# Patient Record
Sex: Male | Born: 1935
Health system: Southern US, Community
[De-identification: ages and names within clinical notes are randomized; demographics above are authoritative.]

## PROBLEM LIST (undated history)

## (undated) DIAGNOSIS — I10 Essential (primary) hypertension: Secondary | ICD-10-CM

## (undated) HISTORY — PX: PACEMAKER INSERTION: SHX728

---

## 2015-11-07 DIAGNOSIS — M545 Low back pain: Secondary | ICD-10-CM | POA: Diagnosis not present

## 2015-11-07 DIAGNOSIS — J101 Influenza due to other identified influenza virus with other respiratory manifestations: Secondary | ICD-10-CM | POA: Diagnosis not present

## 2016-01-13 DIAGNOSIS — I10 Essential (primary) hypertension: Secondary | ICD-10-CM | POA: Diagnosis not present

## 2016-01-13 DIAGNOSIS — I480 Paroxysmal atrial fibrillation: Secondary | ICD-10-CM | POA: Diagnosis not present

## 2016-01-13 DIAGNOSIS — I340 Nonrheumatic mitral (valve) insufficiency: Secondary | ICD-10-CM | POA: Diagnosis not present

## 2016-01-13 DIAGNOSIS — Z950 Presence of cardiac pacemaker: Secondary | ICD-10-CM | POA: Diagnosis not present

## 2016-04-15 DIAGNOSIS — N529 Male erectile dysfunction, unspecified: Secondary | ICD-10-CM | POA: Diagnosis not present

## 2016-04-15 DIAGNOSIS — R319 Hematuria, unspecified: Secondary | ICD-10-CM | POA: Diagnosis not present

## 2016-04-15 DIAGNOSIS — R300 Dysuria: Secondary | ICD-10-CM | POA: Diagnosis not present

## 2016-04-15 DIAGNOSIS — N401 Enlarged prostate with lower urinary tract symptoms: Secondary | ICD-10-CM | POA: Diagnosis not present

## 2016-06-24 DIAGNOSIS — Z950 Presence of cardiac pacemaker: Secondary | ICD-10-CM | POA: Diagnosis not present

## 2016-06-24 DIAGNOSIS — I10 Essential (primary) hypertension: Secondary | ICD-10-CM | POA: Diagnosis not present

## 2016-06-24 DIAGNOSIS — I495 Sick sinus syndrome: Secondary | ICD-10-CM | POA: Diagnosis not present

## 2016-06-24 DIAGNOSIS — I480 Paroxysmal atrial fibrillation: Secondary | ICD-10-CM | POA: Diagnosis not present

## 2016-07-22 DIAGNOSIS — E784 Other hyperlipidemia: Secondary | ICD-10-CM | POA: Diagnosis not present

## 2016-07-22 DIAGNOSIS — I480 Paroxysmal atrial fibrillation: Secondary | ICD-10-CM | POA: Diagnosis not present

## 2016-07-22 DIAGNOSIS — I495 Sick sinus syndrome: Secondary | ICD-10-CM | POA: Diagnosis not present

## 2016-07-22 DIAGNOSIS — K5730 Diverticulosis of large intestine without perforation or abscess without bleeding: Secondary | ICD-10-CM | POA: Diagnosis not present

## 2016-07-22 DIAGNOSIS — I4892 Unspecified atrial flutter: Secondary | ICD-10-CM | POA: Diagnosis not present

## 2016-07-22 DIAGNOSIS — I10 Essential (primary) hypertension: Secondary | ICD-10-CM | POA: Diagnosis not present

## 2016-07-22 DIAGNOSIS — Z0000 Encounter for general adult medical examination without abnormal findings: Secondary | ICD-10-CM | POA: Diagnosis not present

## 2016-09-25 DIAGNOSIS — Z950 Presence of cardiac pacemaker: Secondary | ICD-10-CM | POA: Diagnosis not present

## 2016-12-25 DIAGNOSIS — Z950 Presence of cardiac pacemaker: Secondary | ICD-10-CM | POA: Diagnosis not present

## 2017-07-26 DIAGNOSIS — Z23 Encounter for immunization: Secondary | ICD-10-CM | POA: Diagnosis not present

## 2017-08-02 DIAGNOSIS — I480 Paroxysmal atrial fibrillation: Secondary | ICD-10-CM | POA: Diagnosis not present

## 2017-08-02 DIAGNOSIS — I495 Sick sinus syndrome: Secondary | ICD-10-CM | POA: Diagnosis not present

## 2017-08-02 DIAGNOSIS — I10 Essential (primary) hypertension: Secondary | ICD-10-CM | POA: Diagnosis not present

## 2017-08-02 DIAGNOSIS — I340 Nonrheumatic mitral (valve) insufficiency: Secondary | ICD-10-CM | POA: Diagnosis not present

## 2017-08-02 DIAGNOSIS — E7849 Other hyperlipidemia: Secondary | ICD-10-CM | POA: Diagnosis not present

## 2017-08-02 DIAGNOSIS — Z0000 Encounter for general adult medical examination without abnormal findings: Secondary | ICD-10-CM | POA: Diagnosis not present

## 2018-03-05 ENCOUNTER — Emergency Department (HOSPITAL_BASED_OUTPATIENT_CLINIC_OR_DEPARTMENT_OTHER)
Admission: EM | Admit: 2018-03-05 | Discharge: 2018-03-05 | Disposition: A | Discharge status: Home / Self Care | Payer: PPO | Attending: Emergency Medicine | Admitting: Emergency Medicine

## 2018-03-05 ENCOUNTER — Other Ambulatory Visit: Payer: Self-pay

## 2018-03-05 ENCOUNTER — Emergency Department (HOSPITAL_BASED_OUTPATIENT_CLINIC_OR_DEPARTMENT_OTHER): Payer: PPO

## 2018-03-05 ENCOUNTER — Encounter (HOSPITAL_BASED_OUTPATIENT_CLINIC_OR_DEPARTMENT_OTHER): Payer: Self-pay | Admitting: Emergency Medicine

## 2018-03-05 DIAGNOSIS — I10 Essential (primary) hypertension: Secondary | ICD-10-CM | POA: Insufficient documentation

## 2018-03-05 DIAGNOSIS — Z7982 Long term (current) use of aspirin: Secondary | ICD-10-CM | POA: Insufficient documentation

## 2018-03-05 DIAGNOSIS — Z79899 Other long term (current) drug therapy: Secondary | ICD-10-CM | POA: Insufficient documentation

## 2018-03-05 DIAGNOSIS — Z950 Presence of cardiac pacemaker: Secondary | ICD-10-CM | POA: Diagnosis not present

## 2018-03-05 DIAGNOSIS — R310 Gross hematuria: Secondary | ICD-10-CM | POA: Diagnosis not present

## 2018-03-05 DIAGNOSIS — R319 Hematuria, unspecified: Secondary | ICD-10-CM | POA: Diagnosis not present

## 2018-03-05 HISTORY — DX: Essential (primary) hypertension: I10

## 2018-03-05 LAB — BASIC METABOLIC PANEL
Anion gap: 9 (ref 5–15)
BUN: 22 mg/dL (ref 8–23)
CALCIUM: 8.8 mg/dL — AB (ref 8.9–10.3)
CO2: 24 mmol/L (ref 22–32)
CREATININE: 0.97 mg/dL (ref 0.61–1.24)
Chloride: 107 mmol/L (ref 98–111)
GFR calc Af Amer: >60 mL/min (ref >60)
GLUCOSE: 96 mg/dL (ref 70–99)
Potassium: 3.9 mmol/L (ref 3.5–5.1)
Sodium: 140 mmol/L (ref 135–145)

## 2018-03-05 LAB — URINALYSIS, MICROSCOPIC (REFLEX): RBC / HPF: >50 RBC/hpf (ref 0–5)

## 2018-03-05 LAB — URINALYSIS, ROUTINE W REFLEX MICROSCOPIC

## 2018-03-05 LAB — CBC WITH DIFFERENTIAL/PLATELET
Basophils Absolute: 0 10*3/uL (ref 0.0–0.1)
Basophils Relative: 0 %
EOS ABS: 0 10*3/uL (ref 0.0–0.7)
EOS PCT: 1 %
HCT: 35.4 % — ABNORMAL LOW (ref 39.0–52.0)
Hemoglobin: 12 g/dL — ABNORMAL LOW (ref 13.0–17.0)
Lymphocytes Relative: 26 %
Lymphs Abs: 1.3 10*3/uL (ref 0.7–4.0)
MCH: 32.6 pg (ref 26.0–34.0)
MCHC: 33.9 g/dL (ref 30.0–36.0)
MCV: 96.2 fL (ref 78.0–100.0)
MONO ABS: 0.5 10*3/uL (ref 0.1–1.0)
Monocytes Relative: 9 %
Neutro Abs: 3.4 10*3/uL (ref 1.7–7.7)
Neutrophils Relative %: 64 %
PLATELETS: 171 10*3/uL (ref 150–400)
RBC: 3.68 MIL/uL — AB (ref 4.22–5.81)
RDW: 12.4 % (ref 11.5–15.5)
WBC: 5.2 10*3/uL (ref 4.0–10.5)

## 2018-03-05 NOTE — ED Triage Notes (Signed)
Pt reports hematuria since Thursday. Denies pain.

## 2018-03-05 NOTE — ED Notes (Signed)
[facility] Alert, NAD, calm, interactive, resps e/u, speaking in clear complete sentences, no dyspnea noted, skin W&D, recent VSS, (denies: pain, sob, nausea, dizziness or visual changes), steady on feet, "ready to go". [facility]

## 2018-03-05 NOTE — ED Notes (Signed)
Patient transported to CT 

## 2018-03-05 NOTE — ED Provider Notes (Signed)
[facility] EMERGENCY DEPARTMENT Provider Note   CSN: [id number] Arrival date & time: 03/05/18  1630     History   Chief Complaint Chief Complaint  Patient presents with  . Hematuria    HPI Casey Marshall is a 84 y.o. male with PMH/o HTN, inguinal hernia who presents for evaluation of hematuria that began 3 days ago.  Patient reports that initially, it was intermittent.  He states that it improved yesterday and then return earlier today.  Patient states he has not noticed passing any clots.  Patient reports he has not had any difficulty urinating.  Patient reports that he initially thought that he was may be passing a kidney stone which was causing the blood in urine.  He states that he always occasionally has some lower back pain but states that maybe he noticed a little bit of worsening flank pain, on the right side.  He states that he has been able to do his normal activities without any difficulties.  He reports some intermittent burning with urination but states that he always has some at baseline.  Patient states that he has not had any fevers, abdominal pain, testicular pain or swelling, penile pain or swelling.  Patient denies any recent fevers, night sweats.  He states he has noticed a small amount of weight loss but states he does not know over what specific time that has been going on.  She denies any smoking history.  The history is provided by the patient.    Past Medical History:  Diagnosis Date  . Hypertension     There are no active problems to display for this patient.   Past Surgical History:  Procedure Laterality Date  . PACEMAKER INSERTION          Home Medications    Prior to Admission medications   Medication Sig Start Date End Date Taking? Authorizing Provider  aspirin 325 MG EC tablet Take 325 mg by mouth daily.   Yes [provider]  losartan (COZAAR) 25 MG tablet Take 25 mg by mouth daily.   Yes [provider]    omeprazole (PRILOSEC) 20 MG capsule Take 20 mg by mouth daily.   Yes [provider]    Family History No family history on file.  Social History Social History   Tobacco Use  . Smoking status: Never Smoker  . Smokeless tobacco: Never Used  Substance Use Topics  . Alcohol use: Not Currently  . Drug use: Never     Allergies   Patient has no known allergies.   Review of Systems Review of Systems  Constitutional: Negative for fever.  Genitourinary: Positive for hematuria. Negative for dysuria, frequency, penile pain, penile swelling, scrotal swelling and testicular pain.     Physical Exam Updated Vital Signs BP (!) 154/91 (BP Location: Right Arm)   Pulse 71   Temp 97.7 F (36.5 C) (Oral)   Resp 18   Ht 5\' 7"  (1.702 m)   Wt 68 kg (150 lb)   SpO2 97%   BMI 23.49 kg/m   Physical Exam  Constitutional: He is oriented to person, place, and time. He appears well-developed and well-nourished.  HENT:  Head: Normocephalic and atraumatic.  Mouth/Throat: Oropharynx is clear and moist and mucous membranes are normal.  Eyes: Pupils are equal, round, and reactive to light. Conjunctivae, EOM and lids are normal.  Neck: Full passive range of motion without pain.  Cardiovascular: Normal rate, regular rhythm, normal heart sounds and normal pulses.  Exam reveals no gallop and no friction rub.  No murmur heard. Pulmonary/Chest: Effort normal and breath sounds normal.  Abdominal: Soft. Normal appearance. There is no tenderness. There is CVA tenderness (right). There is no rigidity and no guarding. Hernia confirmed negative in the right inguinal area and confirmed negative in the left inguinal area.  Genitourinary: Testes normal and penis normal. Circumcised.  Genitourinary Comments: The exam was performed with a chaperone present. Normal male genitalia. No evidence of rash, ulcers or lesions.   Musculoskeletal: Normal range of motion.  Neurological: He is alert and oriented  to person, place, and time.  Skin: Skin is warm and dry. Capillary refill takes less than 2 seconds.  Psychiatric: He has a normal mood and affect. His speech is normal.  Nursing note and vitals reviewed.    ED Treatments / Results  Labs (all labs ordered are listed, but only abnormal results are displayed) Labs Reviewed  URINALYSIS, ROUTINE W REFLEX MICROSCOPIC - Abnormal; Notable for the following components:      Result Value   Color, Urine RED (*)    APPearance TURBID (*)    Glucose, UA   (*)    Value: TEST NOT REPORTED DUE TO COLOR INTERFERENCE OF URINE PIGMENT   Hgb urine dipstick   (*)    Value: TEST NOT REPORTED DUE TO COLOR INTERFERENCE OF URINE PIGMENT   Bilirubin Urine   (*)    Value: TEST NOT REPORTED DUE TO COLOR INTERFERENCE OF URINE PIGMENT   Ketones, ur   (*)    Value: TEST NOT REPORTED DUE TO COLOR INTERFERENCE OF URINE PIGMENT   Protein, ur   (*)    Value: TEST NOT REPORTED DUE TO COLOR INTERFERENCE OF URINE PIGMENT   Nitrite   (*)    Value: TEST NOT REPORTED DUE TO COLOR INTERFERENCE OF URINE PIGMENT   Leukocytes, UA   (*)    Value: TEST NOT REPORTED DUE TO COLOR INTERFERENCE OF URINE PIGMENT   All other components within normal limits  URINALYSIS, MICROSCOPIC (REFLEX) - Abnormal; Notable for the following components:   Bacteria, UA MANY (*)    All other components within normal limits  BASIC METABOLIC PANEL - Abnormal; Notable for the following components:   Calcium 8.8 (*)    All other components within normal limits  CBC WITH DIFFERENTIAL/PLATELET - Abnormal; Notable for the following components:   RBC 3.68 (*)    Hemoglobin 12.0 (*)    HCT 35.4 (*)    All other components within normal limits  URINE CULTURE    EKG None  Radiology Ct Renal Stone Study  Result Date: 03/05/2018 CLINICAL DATA:  Hematuria for 3 days. No pain. No history of stones. EXAM: CT ABDOMEN AND PELVIS WITHOUT CONTRAST TECHNIQUE: Multidetector CT imaging of the abdomen and  pelvis was performed following the standard protocol without IV contrast. COMPARISON:  None. FINDINGS: Lower chest: Cardiomegaly. Pacemaker device. Small hiatal hernia. Lower chest otherwise normal. Hepatobiliary: Previous cholecystectomy. Suspected mass in the hepatic dome measuring up to 2.6 cm on series 2, image 11 and series 5, image 19. No other masses identified. Pancreas: Unremarkable. No pancreatic ductal dilatation or surrounding inflammatory changes. Spleen: Normal in size without focal abnormality. Adrenals/Urinary Tract: The adrenal glands are normal. No renal stones or masses. No hydronephrosis. The ureters are normal in caliber. No ureteral stones. There is a diverticulum off the left side of the bladder. There appears to be a nodule measuring 9 mm in the posterior  bladder to the left as seen on series 2, image 65 in coronal image 55. Stomach/Bowel: There is a small hiatal hernia. The stomach and small bowel are normal. Colonic diverticulosis without diverticulitis. Fecal loading throughout the colon. The appendix is not visualized but there is no secondary evidence of appendicitis. Vascular/Lymphatic: Atherosclerotic changes are seen in the nonaneurysmal aorta. No adenopathy. Reproductive: Prostate is unremarkable. Other: No abdominal wall hernia or abnormality. No abdominopelvic ascites. Musculoskeletal: No acute or significant osseous findings. IMPRESSION: 1. There is a rounded region of increased attenuation in the left posterior bladder. Given history of hematuria, this could represent a small bladder neoplasm. Blood clot, tumefactive debris, or a fungal ball could have a similar appearance. Recommend urologic consultation with cystoscopy. 2. There is an indeterminate 2.6 cm mass in the hepatic dome. A contrast-enhanced study could better evaluate. Due to the patient's pacemaker, an MRI is not possible. If the patient cannot have contrast for a CT scan, an ultrasound could be performed. 3. Colonic  diverticulosis without diverticulitis. Moderate fecal loading in the colon. 4. Atherosclerotic changes in the nonaneurysmal aorta. Electronically Signed   By: Gerome Sam III M.D   On: 03/05/2018 18:49    Procedures Procedures (including critical care time)  Medications Ordered in ED Medications - No data to display   Initial Impression / Assessment and Plan / ED Course  I have reviewed the triage vital signs and the nursing notes.  Pertinent labs & imaging results that were available during my care of the patient were reviewed by me and considered in my medical decision making (see chart for details).     84 y.o. male who presents for evaluation of hematuria that is been ongoing for last 3 days.  No passing clots, difficulty urinating, fevers, night sweats.  Does report that he has seen [facility] for for previous issues.  No testicular pain or swelling. Patient is afebrile, non-toxic appearing, sitting comfortably on examination table. Vital signs reviewed and stable.  No abnormalities on GU exam.  Patient with mild right-sided CVA tenderness.  Patient is a poor historian is unclear if his pain is new or is his normal pain at baseline.  Low suspicion for kidney stone but a consideration given CVA tenderness and hematuria. Also consider neoplasm.  At this time, patient is not having difficulty urinating.  UA ordered at triage.  Will add additional basic labs, CT renal study for evaluation.  BMP shows normal BUN and creatinine.  CBC without any significant leukocytosis.  Hemoglobin is 12.0.  UA is unable to be evaluated secondary to gross hematuria.  There is mention of pyuria.  We will plan to send for urine culture.  CT renal study shows an area of increased attenuation left posterior bladder that could be suspicious for bladder neoplasm.  Additionally, there is an indeterminate 2.6 cm mass in the hepatic dome.  No evidence of kidney stones.  At this time, patient exhibits no signs  of traction is able to urinate without any difficulty.  Patient will need urology follow-up for cystoscopy and further evaluation of gross hematuria and abnormality seen on CT scan.  Discussed results with patient and family.  Patient sees [facility] though he states he has not seen them for several years.  Patient states that he has not had a recent appointment then.  Will plan to also give outpatient [facility] who is on-call.  Talk to patient to follow-up with them the next 24 to 48 hours for further  evaluation. Patient had ample opportunity for questions and discussion. All patient's questions were answered with full understanding. Strict return precautions discussed. Patient expresses understanding and agreement to plan.   Final Clinical Impressions(s) / ED Diagnoses   Final diagnoses:  Gross hematuria    ED Discharge Orders    None       Rosana Hoes 03/05/18 2024    Little, Ambrose Finland, MD 03/11/18 0003

## 2018-03-05 NOTE — Discharge Instructions (Signed)
As we discussed, there was an abnormal finding on your CT scan that needs further evaluation by your urologist.  Please contact your urologist for evaluation in the next 2 to 4 days.  Additionally I provided you our on-call urologist.  Please call them if he cannot get in with your urologist.  Return to the emergency department for inability to pee, fever, pain, passing clots or any other worsening or concerning symptoms.

## 2018-03-07 LAB — URINE CULTURE: Culture: NO GROWTH

## 2018-03-08 DIAGNOSIS — R319 Hematuria, unspecified: Secondary | ICD-10-CM | POA: Diagnosis not present

## 2018-04-19 DIAGNOSIS — N529 Male erectile dysfunction, unspecified: Secondary | ICD-10-CM | POA: Diagnosis not present

## 2018-04-19 DIAGNOSIS — N401 Enlarged prostate with lower urinary tract symptoms: Secondary | ICD-10-CM | POA: Diagnosis not present

## 2018-08-09 DIAGNOSIS — N529 Male erectile dysfunction, unspecified: Secondary | ICD-10-CM | POA: Diagnosis not present

## 2018-08-09 DIAGNOSIS — R319 Hematuria, unspecified: Secondary | ICD-10-CM | POA: Diagnosis not present

## 2018-08-09 DIAGNOSIS — N401 Enlarged prostate with lower urinary tract symptoms: Secondary | ICD-10-CM | POA: Diagnosis not present

## 2018-08-15 DIAGNOSIS — Z0000 Encounter for general adult medical examination without abnormal findings: Secondary | ICD-10-CM | POA: Diagnosis not present

## 2018-08-15 DIAGNOSIS — I10 Essential (primary) hypertension: Secondary | ICD-10-CM | POA: Diagnosis not present

## 2018-08-15 DIAGNOSIS — E7849 Other hyperlipidemia: Secondary | ICD-10-CM | POA: Diagnosis not present

## 2018-12-02 IMAGING — CT CT RENAL STONE PROTOCOL
2 of 4 series · 15 of 46 positions shown, 17 images · non-contrast
Comparison: None.

CLINICAL DATA: Hematuria for 3 days. No pain. No history of stones.

EXAM:
CT ABDOMEN AND PELVIS WITHOUT CONTRAST
TECHNIQUE: Multidetector CT imaging of the abdomen and pelvis was performed
following the standard protocol without IV contrast.

[Series 2: axial st · axial · 0.73mm/px · z∈[-428,-58]mm · 12 of 85 slices shown, 14 images]
[im 7/85  soft-tissue]
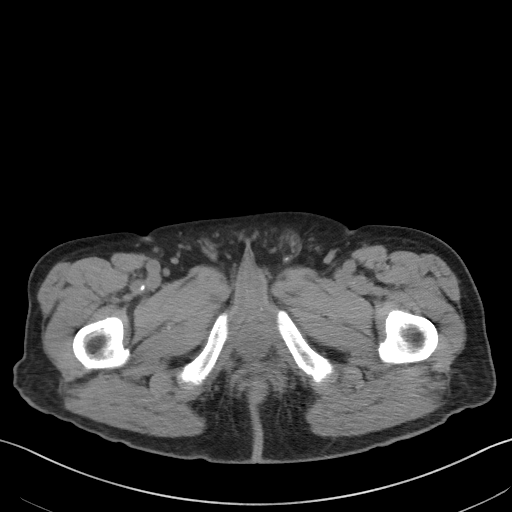
[im 7/85  bone]
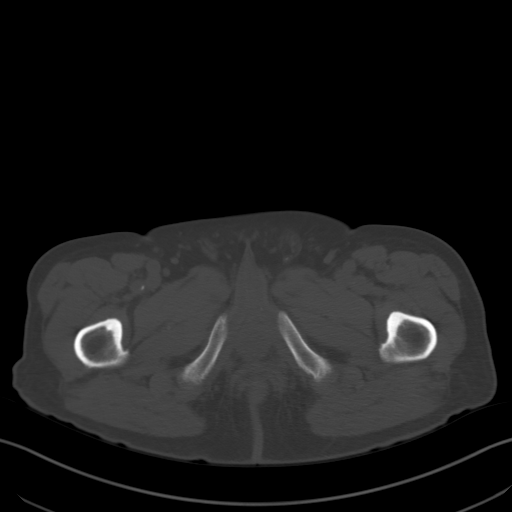
[im 14/85  soft-tissue]
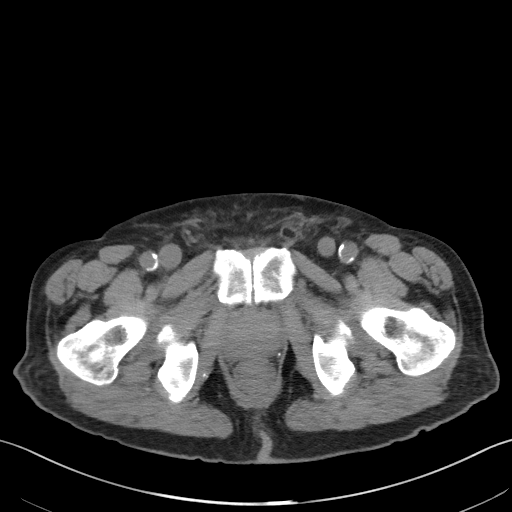
[im 21/85  soft-tissue]
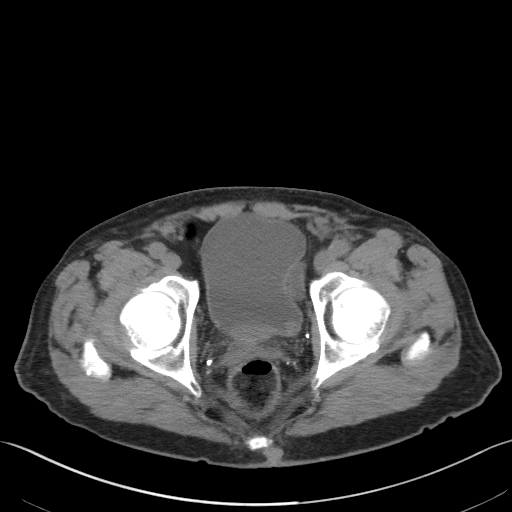
[im 27/85  soft-tissue]
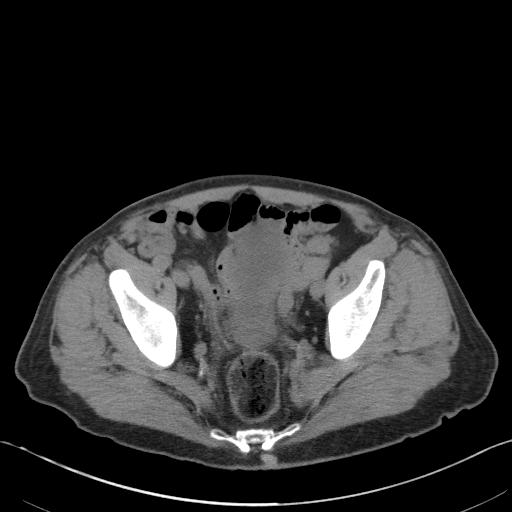
[im 34/85  soft-tissue]
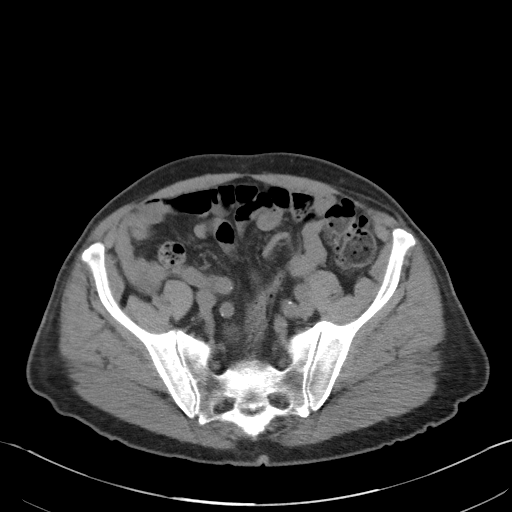
[im 41/85  soft-tissue]
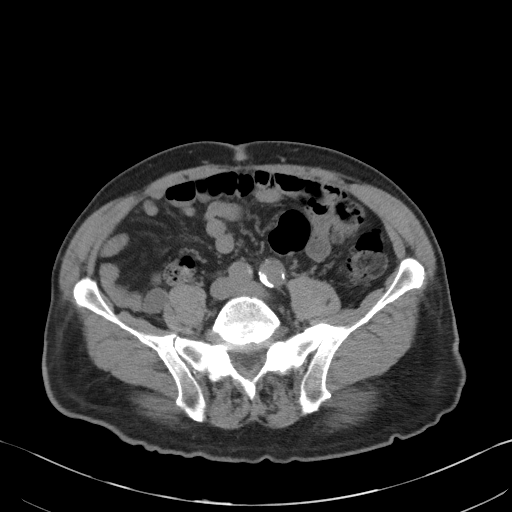
[im 48/85  soft-tissue]
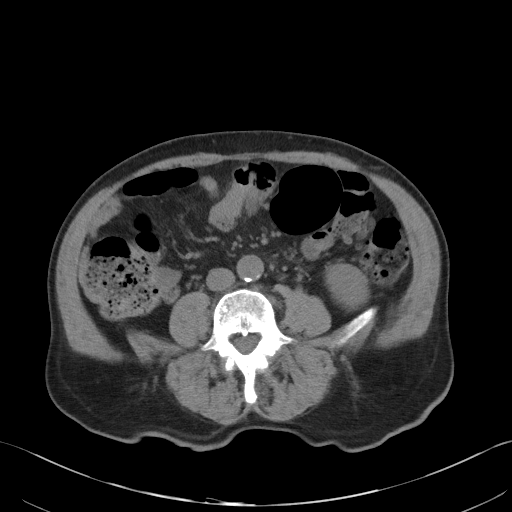
[im 54/85  soft-tissue]
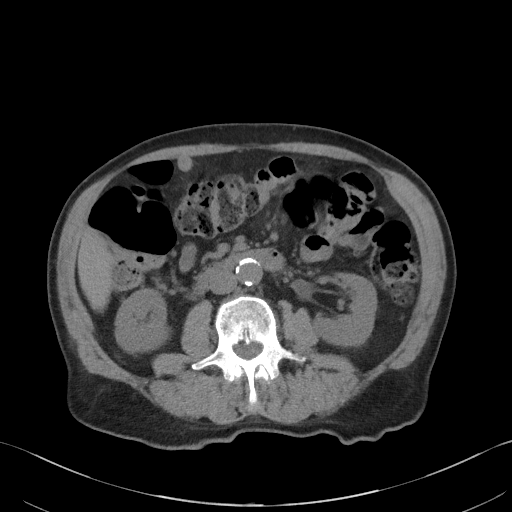
[im 61/85  soft-tissue]
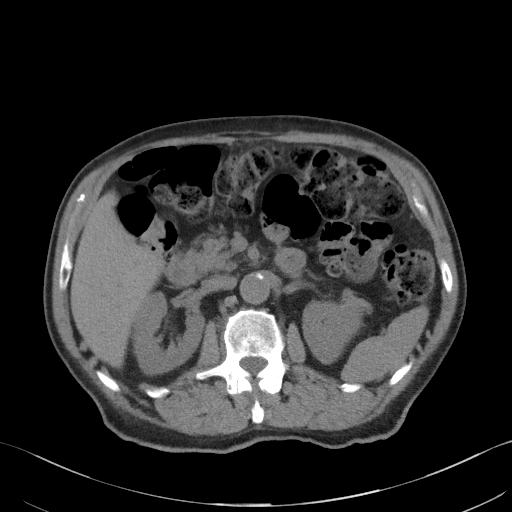
[im 61/85  bone]
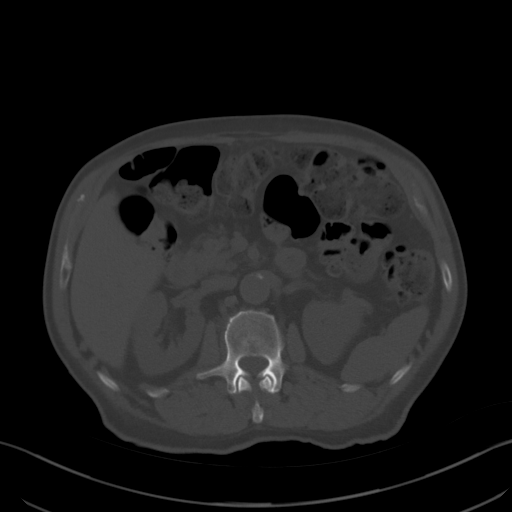
[im 68/85  soft-tissue]
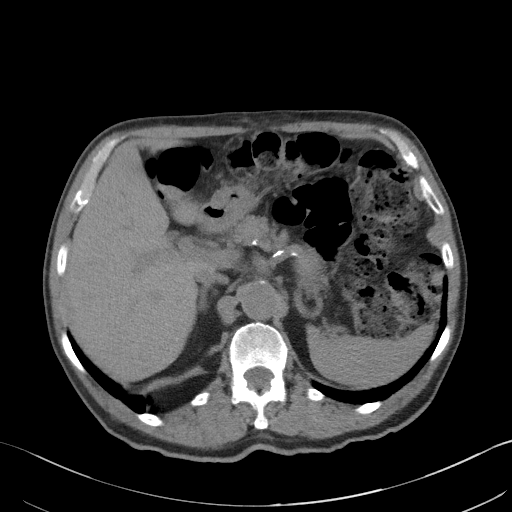
[im 74/85  soft-tissue]
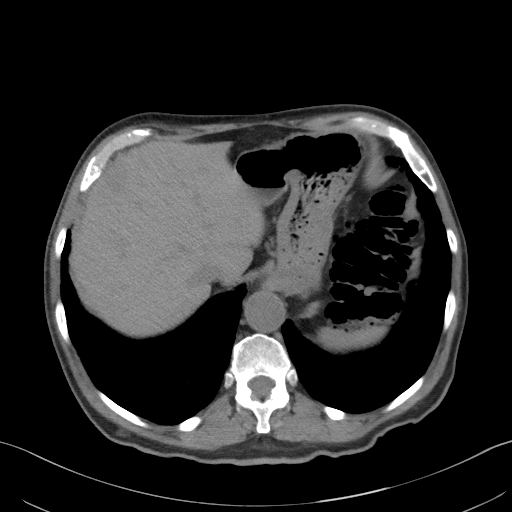
[im 81/85  soft-tissue]
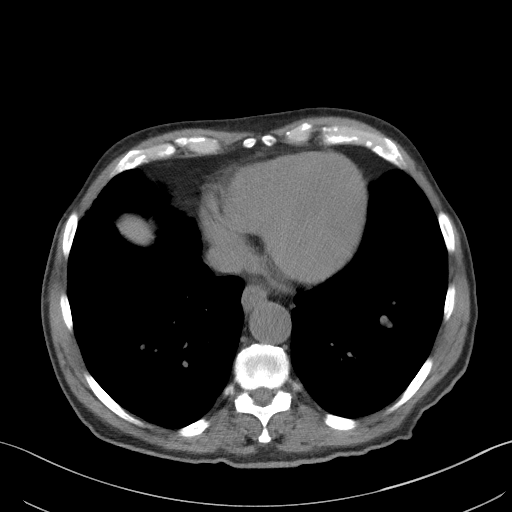

[Series 5: coronal st · coronal · 0.67mm/px · 3 of 89 slices shown]
[im 30/89  soft-tissue]
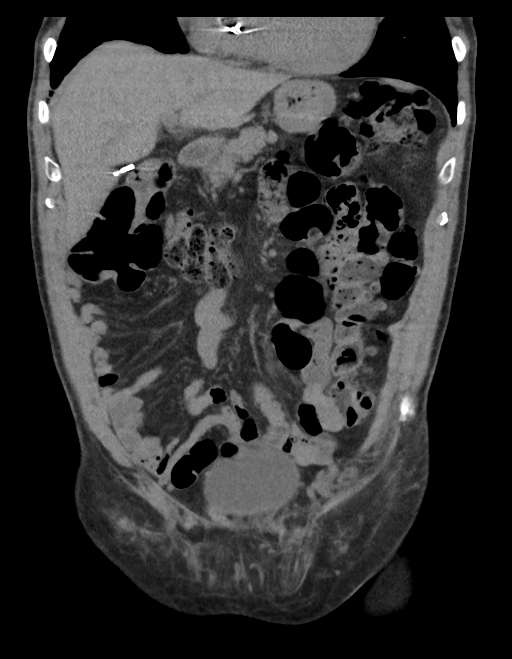
[im 40/89  soft-tissue]
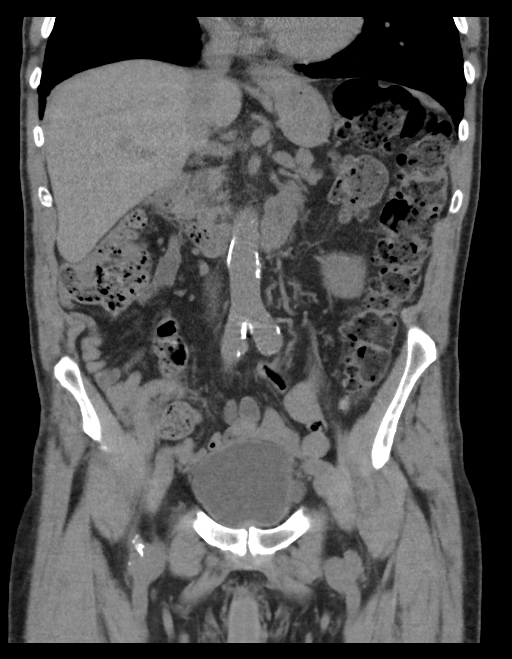
[im 49/89  soft-tissue]
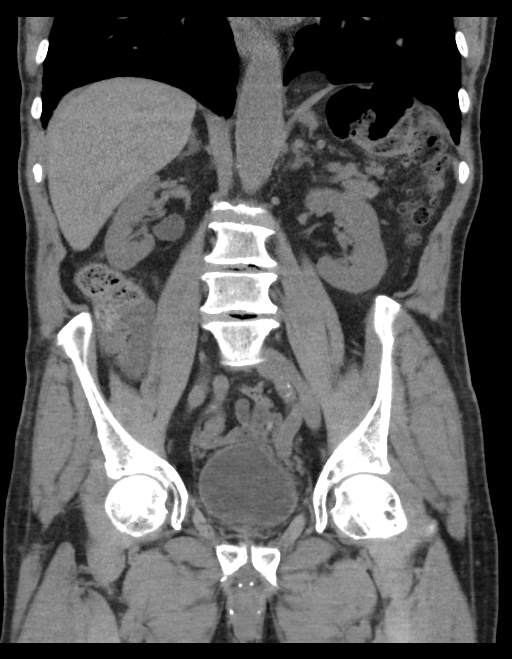

[15 of 46 positions shown; findings below may reference images not displayed]

FINDINGS: Lower chest: Cardiomegaly. Pacemaker device. Small hiatal hernia.
Lower chest otherwise normal.

Hepatobiliary: Previous cholecystectomy. Suspected mass in the
hepatic dome measuring up to 2.6 cm on series 2, image 11 and series
5, image 19. No other masses identified.

Pancreas: Unremarkable. No pancreatic ductal dilatation or
surrounding inflammatory changes.

Spleen: Normal in size without focal abnormality.

Adrenals/Urinary Tract: The adrenal glands are normal. No renal
stones or masses. No hydronephrosis. The ureters are normal in
caliber. No ureteral stones. There is a diverticulum off the left
side of the bladder. There appears to be a nodule measuring 9 mm in
the posterior bladder to the left as seen on series 2, image 65 in
coronal image 55.

Stomach/Bowel: There is a small hiatal hernia. The stomach and small
bowel are normal. Colonic diverticulosis without diverticulitis.
Fecal loading throughout the colon. The appendix is not visualized
but there is no secondary evidence of appendicitis.

Vascular/Lymphatic: Atherosclerotic changes are seen in the
nonaneurysmal aorta. No adenopathy.

Reproductive: Prostate is unremarkable.

Other: No abdominal wall hernia or abnormality. No abdominopelvic
ascites.

Musculoskeletal: No acute or significant osseous findings.
IMPRESSION: 1. There is a rounded region of increased attenuation in the left
posterior bladder. Given history of hematuria, this could represent
a small bladder neoplasm. Blood clot, tumefactive debris, or a
fungal ball could have a similar appearance. Recommend urologic
consultation with cystoscopy.
2. There is an indeterminate 2.6 cm mass in the hepatic dome. A
contrast-enhanced study could better evaluate. Due to the patient's
pacemaker, an MRI is not possible. If the patient cannot have
contrast for a CT scan, an ultrasound could be performed.
3. Colonic diverticulosis without diverticulitis. Moderate fecal
loading in the colon.
4. Atherosclerotic changes in the nonaneurysmal aorta.

## 2019-02-21 DIAGNOSIS — R5383 Other fatigue: Secondary | ICD-10-CM | POA: Diagnosis not present

## 2019-02-21 DIAGNOSIS — R8290 Unspecified abnormal findings in urine: Secondary | ICD-10-CM | POA: Diagnosis not present

## 2019-02-21 DIAGNOSIS — R309 Painful micturition, unspecified: Secondary | ICD-10-CM | POA: Diagnosis not present

## 2019-06-15 DIAGNOSIS — Z20828 Contact with and (suspected) exposure to other viral communicable diseases: Secondary | ICD-10-CM | POA: Diagnosis not present

## 2019-07-18 DIAGNOSIS — E7849 Other hyperlipidemia: Secondary | ICD-10-CM | POA: Diagnosis not present

## 2019-07-18 DIAGNOSIS — I498 Other specified cardiac arrhythmias: Secondary | ICD-10-CM | POA: Diagnosis not present

## 2019-07-18 DIAGNOSIS — Z950 Presence of cardiac pacemaker: Secondary | ICD-10-CM | POA: Diagnosis not present

## 2019-07-18 DIAGNOSIS — I480 Paroxysmal atrial fibrillation: Secondary | ICD-10-CM | POA: Diagnosis not present

## 2019-07-18 DIAGNOSIS — I10 Essential (primary) hypertension: Secondary | ICD-10-CM | POA: Diagnosis not present

## 2019-07-18 DIAGNOSIS — I447 Left bundle-branch block, unspecified: Secondary | ICD-10-CM | POA: Diagnosis not present

## 2019-07-18 DIAGNOSIS — Z952 Presence of prosthetic heart valve: Secondary | ICD-10-CM | POA: Diagnosis not present

## 2019-08-22 DIAGNOSIS — Z0000 Encounter for general adult medical examination without abnormal findings: Secondary | ICD-10-CM | POA: Diagnosis not present

## 2019-08-22 DIAGNOSIS — I480 Paroxysmal atrial fibrillation: Secondary | ICD-10-CM | POA: Diagnosis not present

## 2019-08-22 DIAGNOSIS — I2510 Atherosclerotic heart disease of native coronary artery without angina pectoris: Secondary | ICD-10-CM | POA: Diagnosis not present

## 2019-08-22 DIAGNOSIS — K5730 Diverticulosis of large intestine without perforation or abscess without bleeding: Secondary | ICD-10-CM | POA: Diagnosis not present

## 2019-08-22 DIAGNOSIS — I340 Nonrheumatic mitral (valve) insufficiency: Secondary | ICD-10-CM | POA: Diagnosis not present

## 2019-08-22 DIAGNOSIS — Z952 Presence of prosthetic heart valve: Secondary | ICD-10-CM | POA: Diagnosis not present

## 2019-08-22 DIAGNOSIS — Z23 Encounter for immunization: Secondary | ICD-10-CM | POA: Diagnosis not present

## 2019-08-22 DIAGNOSIS — I10 Essential (primary) hypertension: Secondary | ICD-10-CM | POA: Diagnosis not present

## 2019-08-22 DIAGNOSIS — E7849 Other hyperlipidemia: Secondary | ICD-10-CM | POA: Diagnosis not present

## 2020-09-05 DIAGNOSIS — Z0000 Encounter for general adult medical examination without abnormal findings: Secondary | ICD-10-CM | POA: Diagnosis not present

## 2020-09-05 DIAGNOSIS — I4510 Unspecified right bundle-branch block: Secondary | ICD-10-CM | POA: Diagnosis not present

## 2020-09-05 DIAGNOSIS — E7849 Other hyperlipidemia: Secondary | ICD-10-CM | POA: Diagnosis not present

## 2020-09-05 DIAGNOSIS — R5383 Other fatigue: Secondary | ICD-10-CM | POA: Diagnosis not present

## 2020-09-05 DIAGNOSIS — I2510 Atherosclerotic heart disease of native coronary artery without angina pectoris: Secondary | ICD-10-CM | POA: Diagnosis not present

## 2020-09-05 DIAGNOSIS — I340 Nonrheumatic mitral (valve) insufficiency: Secondary | ICD-10-CM | POA: Diagnosis not present

## 2020-09-05 DIAGNOSIS — I495 Sick sinus syndrome: Secondary | ICD-10-CM | POA: Diagnosis not present

## 2020-09-05 DIAGNOSIS — K5730 Diverticulosis of large intestine without perforation or abscess without bleeding: Secondary | ICD-10-CM | POA: Diagnosis not present

## 2020-09-05 DIAGNOSIS — I4892 Unspecified atrial flutter: Secondary | ICD-10-CM | POA: Diagnosis not present

## 2020-09-05 DIAGNOSIS — Z952 Presence of prosthetic heart valve: Secondary | ICD-10-CM | POA: Diagnosis not present

## 2020-09-05 DIAGNOSIS — I480 Paroxysmal atrial fibrillation: Secondary | ICD-10-CM | POA: Diagnosis not present

## 2020-09-05 DIAGNOSIS — I10 Essential (primary) hypertension: Secondary | ICD-10-CM | POA: Diagnosis not present

## 2020-09-05 DIAGNOSIS — G4733 Obstructive sleep apnea (adult) (pediatric): Secondary | ICD-10-CM | POA: Diagnosis not present

## 2020-11-19 DIAGNOSIS — I10 Essential (primary) hypertension: Secondary | ICD-10-CM | POA: Diagnosis not present

## 2020-11-19 DIAGNOSIS — R531 Weakness: Secondary | ICD-10-CM | POA: Diagnosis not present

## 2020-11-19 DIAGNOSIS — R0989 Other specified symptoms and signs involving the circulatory and respiratory systems: Secondary | ICD-10-CM | POA: Diagnosis not present

## 2020-11-19 DIAGNOSIS — R55 Syncope and collapse: Secondary | ICD-10-CM | POA: Diagnosis not present

## 2020-11-19 DIAGNOSIS — R42 Dizziness and giddiness: Secondary | ICD-10-CM | POA: Diagnosis not present

## 2021-05-01 DIAGNOSIS — M9905 Segmental and somatic dysfunction of pelvic region: Secondary | ICD-10-CM | POA: Diagnosis not present

## 2021-05-01 DIAGNOSIS — M9904 Segmental and somatic dysfunction of sacral region: Secondary | ICD-10-CM | POA: Diagnosis not present

## 2021-05-01 DIAGNOSIS — M4316 Spondylolisthesis, lumbar region: Secondary | ICD-10-CM | POA: Diagnosis not present

## 2021-05-01 DIAGNOSIS — Q72891 Other reduction defects of right lower limb: Secondary | ICD-10-CM | POA: Diagnosis not present

## 2021-05-01 DIAGNOSIS — M5136 Other intervertebral disc degeneration, lumbar region: Secondary | ICD-10-CM | POA: Diagnosis not present

## 2021-05-01 DIAGNOSIS — M5432 Sciatica, left side: Secondary | ICD-10-CM | POA: Diagnosis not present

## 2021-05-01 DIAGNOSIS — M5135 Other intervertebral disc degeneration, thoracolumbar region: Secondary | ICD-10-CM | POA: Diagnosis not present

## 2021-05-01 DIAGNOSIS — M5137 Other intervertebral disc degeneration, lumbosacral region: Secondary | ICD-10-CM | POA: Diagnosis not present

## 2021-05-01 DIAGNOSIS — M9903 Segmental and somatic dysfunction of lumbar region: Secondary | ICD-10-CM | POA: Diagnosis not present

## 2021-05-05 DIAGNOSIS — Q72891 Other reduction defects of right lower limb: Secondary | ICD-10-CM | POA: Diagnosis not present

## 2021-05-05 DIAGNOSIS — M5135 Other intervertebral disc degeneration, thoracolumbar region: Secondary | ICD-10-CM | POA: Diagnosis not present

## 2021-05-05 DIAGNOSIS — M5136 Other intervertebral disc degeneration, lumbar region: Secondary | ICD-10-CM | POA: Diagnosis not present

## 2021-05-05 DIAGNOSIS — M9905 Segmental and somatic dysfunction of pelvic region: Secondary | ICD-10-CM | POA: Diagnosis not present

## 2021-05-05 DIAGNOSIS — M9903 Segmental and somatic dysfunction of lumbar region: Secondary | ICD-10-CM | POA: Diagnosis not present

## 2021-05-05 DIAGNOSIS — M5137 Other intervertebral disc degeneration, lumbosacral region: Secondary | ICD-10-CM | POA: Diagnosis not present

## 2021-05-05 DIAGNOSIS — M9904 Segmental and somatic dysfunction of sacral region: Secondary | ICD-10-CM | POA: Diagnosis not present

## 2021-05-05 DIAGNOSIS — M4316 Spondylolisthesis, lumbar region: Secondary | ICD-10-CM | POA: Diagnosis not present

## 2021-05-05 DIAGNOSIS — M5432 Sciatica, left side: Secondary | ICD-10-CM | POA: Diagnosis not present

## 2021-05-06 DIAGNOSIS — M9903 Segmental and somatic dysfunction of lumbar region: Secondary | ICD-10-CM | POA: Diagnosis not present

## 2021-05-06 DIAGNOSIS — M9905 Segmental and somatic dysfunction of pelvic region: Secondary | ICD-10-CM | POA: Diagnosis not present

## 2021-05-06 DIAGNOSIS — M5136 Other intervertebral disc degeneration, lumbar region: Secondary | ICD-10-CM | POA: Diagnosis not present

## 2021-05-06 DIAGNOSIS — M5135 Other intervertebral disc degeneration, thoracolumbar region: Secondary | ICD-10-CM | POA: Diagnosis not present

## 2021-05-06 DIAGNOSIS — Q72891 Other reduction defects of right lower limb: Secondary | ICD-10-CM | POA: Diagnosis not present

## 2021-05-06 DIAGNOSIS — M5432 Sciatica, left side: Secondary | ICD-10-CM | POA: Diagnosis not present

## 2021-05-06 DIAGNOSIS — M5137 Other intervertebral disc degeneration, lumbosacral region: Secondary | ICD-10-CM | POA: Diagnosis not present

## 2021-05-06 DIAGNOSIS — M4316 Spondylolisthesis, lumbar region: Secondary | ICD-10-CM | POA: Diagnosis not present

## 2021-05-06 DIAGNOSIS — M9904 Segmental and somatic dysfunction of sacral region: Secondary | ICD-10-CM | POA: Diagnosis not present

## 2021-05-08 DIAGNOSIS — Q72891 Other reduction defects of right lower limb: Secondary | ICD-10-CM | POA: Diagnosis not present

## 2021-05-08 DIAGNOSIS — M9903 Segmental and somatic dysfunction of lumbar region: Secondary | ICD-10-CM | POA: Diagnosis not present

## 2021-05-08 DIAGNOSIS — M9904 Segmental and somatic dysfunction of sacral region: Secondary | ICD-10-CM | POA: Diagnosis not present

## 2021-05-08 DIAGNOSIS — M5136 Other intervertebral disc degeneration, lumbar region: Secondary | ICD-10-CM | POA: Diagnosis not present

## 2021-05-08 DIAGNOSIS — M5135 Other intervertebral disc degeneration, thoracolumbar region: Secondary | ICD-10-CM | POA: Diagnosis not present

## 2021-05-08 DIAGNOSIS — M4316 Spondylolisthesis, lumbar region: Secondary | ICD-10-CM | POA: Diagnosis not present

## 2021-05-08 DIAGNOSIS — M9905 Segmental and somatic dysfunction of pelvic region: Secondary | ICD-10-CM | POA: Diagnosis not present

## 2021-05-08 DIAGNOSIS — M5432 Sciatica, left side: Secondary | ICD-10-CM | POA: Diagnosis not present

## 2021-05-08 DIAGNOSIS — M5137 Other intervertebral disc degeneration, lumbosacral region: Secondary | ICD-10-CM | POA: Diagnosis not present

## 2021-05-13 DIAGNOSIS — M5135 Other intervertebral disc degeneration, thoracolumbar region: Secondary | ICD-10-CM | POA: Diagnosis not present

## 2021-05-13 DIAGNOSIS — M5137 Other intervertebral disc degeneration, lumbosacral region: Secondary | ICD-10-CM | POA: Diagnosis not present

## 2021-05-13 DIAGNOSIS — M5136 Other intervertebral disc degeneration, lumbar region: Secondary | ICD-10-CM | POA: Diagnosis not present

## 2021-05-13 DIAGNOSIS — M9904 Segmental and somatic dysfunction of sacral region: Secondary | ICD-10-CM | POA: Diagnosis not present

## 2021-05-13 DIAGNOSIS — M4316 Spondylolisthesis, lumbar region: Secondary | ICD-10-CM | POA: Diagnosis not present

## 2021-05-13 DIAGNOSIS — M5432 Sciatica, left side: Secondary | ICD-10-CM | POA: Diagnosis not present

## 2021-05-13 DIAGNOSIS — M9903 Segmental and somatic dysfunction of lumbar region: Secondary | ICD-10-CM | POA: Diagnosis not present

## 2021-05-13 DIAGNOSIS — M9905 Segmental and somatic dysfunction of pelvic region: Secondary | ICD-10-CM | POA: Diagnosis not present

## 2021-05-13 DIAGNOSIS — Q72891 Other reduction defects of right lower limb: Secondary | ICD-10-CM | POA: Diagnosis not present

## 2021-05-15 DIAGNOSIS — Q72891 Other reduction defects of right lower limb: Secondary | ICD-10-CM | POA: Diagnosis not present

## 2021-05-15 DIAGNOSIS — M5137 Other intervertebral disc degeneration, lumbosacral region: Secondary | ICD-10-CM | POA: Diagnosis not present

## 2021-05-15 DIAGNOSIS — M5136 Other intervertebral disc degeneration, lumbar region: Secondary | ICD-10-CM | POA: Diagnosis not present

## 2021-05-15 DIAGNOSIS — M5432 Sciatica, left side: Secondary | ICD-10-CM | POA: Diagnosis not present

## 2021-05-15 DIAGNOSIS — M9904 Segmental and somatic dysfunction of sacral region: Secondary | ICD-10-CM | POA: Diagnosis not present

## 2021-05-15 DIAGNOSIS — M5135 Other intervertebral disc degeneration, thoracolumbar region: Secondary | ICD-10-CM | POA: Diagnosis not present

## 2021-05-15 DIAGNOSIS — M9903 Segmental and somatic dysfunction of lumbar region: Secondary | ICD-10-CM | POA: Diagnosis not present

## 2021-05-15 DIAGNOSIS — M4316 Spondylolisthesis, lumbar region: Secondary | ICD-10-CM | POA: Diagnosis not present

## 2021-05-15 DIAGNOSIS — M9905 Segmental and somatic dysfunction of pelvic region: Secondary | ICD-10-CM | POA: Diagnosis not present

## 2021-05-19 DIAGNOSIS — Q72891 Other reduction defects of right lower limb: Secondary | ICD-10-CM | POA: Diagnosis not present

## 2021-05-19 DIAGNOSIS — M5137 Other intervertebral disc degeneration, lumbosacral region: Secondary | ICD-10-CM | POA: Diagnosis not present

## 2021-05-19 DIAGNOSIS — M9904 Segmental and somatic dysfunction of sacral region: Secondary | ICD-10-CM | POA: Diagnosis not present

## 2021-05-19 DIAGNOSIS — M5136 Other intervertebral disc degeneration, lumbar region: Secondary | ICD-10-CM | POA: Diagnosis not present

## 2021-05-19 DIAGNOSIS — M5432 Sciatica, left side: Secondary | ICD-10-CM | POA: Diagnosis not present

## 2021-05-19 DIAGNOSIS — M9903 Segmental and somatic dysfunction of lumbar region: Secondary | ICD-10-CM | POA: Diagnosis not present

## 2021-05-19 DIAGNOSIS — M9905 Segmental and somatic dysfunction of pelvic region: Secondary | ICD-10-CM | POA: Diagnosis not present

## 2021-05-19 DIAGNOSIS — M4316 Spondylolisthesis, lumbar region: Secondary | ICD-10-CM | POA: Diagnosis not present

## 2021-05-19 DIAGNOSIS — M5135 Other intervertebral disc degeneration, thoracolumbar region: Secondary | ICD-10-CM | POA: Diagnosis not present

## 2021-05-22 DIAGNOSIS — M4316 Spondylolisthesis, lumbar region: Secondary | ICD-10-CM | POA: Diagnosis not present

## 2021-05-22 DIAGNOSIS — M5136 Other intervertebral disc degeneration, lumbar region: Secondary | ICD-10-CM | POA: Diagnosis not present

## 2021-05-22 DIAGNOSIS — M9904 Segmental and somatic dysfunction of sacral region: Secondary | ICD-10-CM | POA: Diagnosis not present

## 2021-05-22 DIAGNOSIS — M9905 Segmental and somatic dysfunction of pelvic region: Secondary | ICD-10-CM | POA: Diagnosis not present

## 2021-05-22 DIAGNOSIS — M5135 Other intervertebral disc degeneration, thoracolumbar region: Secondary | ICD-10-CM | POA: Diagnosis not present

## 2021-05-22 DIAGNOSIS — M9903 Segmental and somatic dysfunction of lumbar region: Secondary | ICD-10-CM | POA: Diagnosis not present

## 2021-05-22 DIAGNOSIS — Q72891 Other reduction defects of right lower limb: Secondary | ICD-10-CM | POA: Diagnosis not present

## 2021-05-22 DIAGNOSIS — M5432 Sciatica, left side: Secondary | ICD-10-CM | POA: Diagnosis not present

## 2021-05-22 DIAGNOSIS — M5137 Other intervertebral disc degeneration, lumbosacral region: Secondary | ICD-10-CM | POA: Diagnosis not present

## 2021-05-26 DIAGNOSIS — M5135 Other intervertebral disc degeneration, thoracolumbar region: Secondary | ICD-10-CM | POA: Diagnosis not present

## 2021-05-26 DIAGNOSIS — M9905 Segmental and somatic dysfunction of pelvic region: Secondary | ICD-10-CM | POA: Diagnosis not present

## 2021-05-26 DIAGNOSIS — Q72891 Other reduction defects of right lower limb: Secondary | ICD-10-CM | POA: Diagnosis not present

## 2021-05-26 DIAGNOSIS — M5136 Other intervertebral disc degeneration, lumbar region: Secondary | ICD-10-CM | POA: Diagnosis not present

## 2021-05-26 DIAGNOSIS — M5432 Sciatica, left side: Secondary | ICD-10-CM | POA: Diagnosis not present

## 2021-05-26 DIAGNOSIS — M9903 Segmental and somatic dysfunction of lumbar region: Secondary | ICD-10-CM | POA: Diagnosis not present

## 2021-05-26 DIAGNOSIS — M9904 Segmental and somatic dysfunction of sacral region: Secondary | ICD-10-CM | POA: Diagnosis not present

## 2021-05-26 DIAGNOSIS — M5137 Other intervertebral disc degeneration, lumbosacral region: Secondary | ICD-10-CM | POA: Diagnosis not present

## 2021-05-26 DIAGNOSIS — M4316 Spondylolisthesis, lumbar region: Secondary | ICD-10-CM | POA: Diagnosis not present

## 2021-05-27 DIAGNOSIS — M5136 Other intervertebral disc degeneration, lumbar region: Secondary | ICD-10-CM | POA: Diagnosis not present

## 2021-05-27 DIAGNOSIS — M5135 Other intervertebral disc degeneration, thoracolumbar region: Secondary | ICD-10-CM | POA: Diagnosis not present

## 2021-05-27 DIAGNOSIS — M5432 Sciatica, left side: Secondary | ICD-10-CM | POA: Diagnosis not present

## 2021-05-27 DIAGNOSIS — Q72891 Other reduction defects of right lower limb: Secondary | ICD-10-CM | POA: Diagnosis not present

## 2021-05-27 DIAGNOSIS — M9905 Segmental and somatic dysfunction of pelvic region: Secondary | ICD-10-CM | POA: Diagnosis not present

## 2021-05-27 DIAGNOSIS — M4316 Spondylolisthesis, lumbar region: Secondary | ICD-10-CM | POA: Diagnosis not present

## 2021-05-27 DIAGNOSIS — M5137 Other intervertebral disc degeneration, lumbosacral region: Secondary | ICD-10-CM | POA: Diagnosis not present

## 2021-05-27 DIAGNOSIS — M9903 Segmental and somatic dysfunction of lumbar region: Secondary | ICD-10-CM | POA: Diagnosis not present

## 2021-05-27 DIAGNOSIS — M9904 Segmental and somatic dysfunction of sacral region: Secondary | ICD-10-CM | POA: Diagnosis not present

## 2021-05-29 DIAGNOSIS — M9904 Segmental and somatic dysfunction of sacral region: Secondary | ICD-10-CM | POA: Diagnosis not present

## 2021-05-29 DIAGNOSIS — M9903 Segmental and somatic dysfunction of lumbar region: Secondary | ICD-10-CM | POA: Diagnosis not present

## 2021-05-29 DIAGNOSIS — M5432 Sciatica, left side: Secondary | ICD-10-CM | POA: Diagnosis not present

## 2021-05-29 DIAGNOSIS — M5135 Other intervertebral disc degeneration, thoracolumbar region: Secondary | ICD-10-CM | POA: Diagnosis not present

## 2021-05-29 DIAGNOSIS — M5137 Other intervertebral disc degeneration, lumbosacral region: Secondary | ICD-10-CM | POA: Diagnosis not present

## 2021-05-29 DIAGNOSIS — M4316 Spondylolisthesis, lumbar region: Secondary | ICD-10-CM | POA: Diagnosis not present

## 2021-05-29 DIAGNOSIS — M5136 Other intervertebral disc degeneration, lumbar region: Secondary | ICD-10-CM | POA: Diagnosis not present

## 2021-05-29 DIAGNOSIS — M9905 Segmental and somatic dysfunction of pelvic region: Secondary | ICD-10-CM | POA: Diagnosis not present

## 2021-05-29 DIAGNOSIS — Q72891 Other reduction defects of right lower limb: Secondary | ICD-10-CM | POA: Diagnosis not present

## 2021-06-02 DIAGNOSIS — M5137 Other intervertebral disc degeneration, lumbosacral region: Secondary | ICD-10-CM | POA: Diagnosis not present

## 2021-06-02 DIAGNOSIS — M4316 Spondylolisthesis, lumbar region: Secondary | ICD-10-CM | POA: Diagnosis not present

## 2021-06-02 DIAGNOSIS — M5135 Other intervertebral disc degeneration, thoracolumbar region: Secondary | ICD-10-CM | POA: Diagnosis not present

## 2021-06-02 DIAGNOSIS — M9903 Segmental and somatic dysfunction of lumbar region: Secondary | ICD-10-CM | POA: Diagnosis not present

## 2021-06-02 DIAGNOSIS — M5432 Sciatica, left side: Secondary | ICD-10-CM | POA: Diagnosis not present

## 2021-06-02 DIAGNOSIS — M9905 Segmental and somatic dysfunction of pelvic region: Secondary | ICD-10-CM | POA: Diagnosis not present

## 2021-06-02 DIAGNOSIS — M5136 Other intervertebral disc degeneration, lumbar region: Secondary | ICD-10-CM | POA: Diagnosis not present

## 2021-06-02 DIAGNOSIS — M9904 Segmental and somatic dysfunction of sacral region: Secondary | ICD-10-CM | POA: Diagnosis not present

## 2021-06-02 DIAGNOSIS — Q72891 Other reduction defects of right lower limb: Secondary | ICD-10-CM | POA: Diagnosis not present

## 2021-06-05 DIAGNOSIS — M5432 Sciatica, left side: Secondary | ICD-10-CM | POA: Diagnosis not present

## 2021-06-05 DIAGNOSIS — M9903 Segmental and somatic dysfunction of lumbar region: Secondary | ICD-10-CM | POA: Diagnosis not present

## 2021-06-05 DIAGNOSIS — M5136 Other intervertebral disc degeneration, lumbar region: Secondary | ICD-10-CM | POA: Diagnosis not present

## 2021-06-05 DIAGNOSIS — M4316 Spondylolisthesis, lumbar region: Secondary | ICD-10-CM | POA: Diagnosis not present

## 2021-06-05 DIAGNOSIS — M9905 Segmental and somatic dysfunction of pelvic region: Secondary | ICD-10-CM | POA: Diagnosis not present

## 2021-06-05 DIAGNOSIS — Q72891 Other reduction defects of right lower limb: Secondary | ICD-10-CM | POA: Diagnosis not present

## 2021-06-05 DIAGNOSIS — M5135 Other intervertebral disc degeneration, thoracolumbar region: Secondary | ICD-10-CM | POA: Diagnosis not present

## 2021-06-05 DIAGNOSIS — M9904 Segmental and somatic dysfunction of sacral region: Secondary | ICD-10-CM | POA: Diagnosis not present

## 2021-06-05 DIAGNOSIS — M5137 Other intervertebral disc degeneration, lumbosacral region: Secondary | ICD-10-CM | POA: Diagnosis not present

## 2021-06-09 DIAGNOSIS — M5136 Other intervertebral disc degeneration, lumbar region: Secondary | ICD-10-CM | POA: Diagnosis not present

## 2021-06-09 DIAGNOSIS — M5135 Other intervertebral disc degeneration, thoracolumbar region: Secondary | ICD-10-CM | POA: Diagnosis not present

## 2021-06-09 DIAGNOSIS — M9904 Segmental and somatic dysfunction of sacral region: Secondary | ICD-10-CM | POA: Diagnosis not present

## 2021-06-09 DIAGNOSIS — M5137 Other intervertebral disc degeneration, lumbosacral region: Secondary | ICD-10-CM | POA: Diagnosis not present

## 2021-06-09 DIAGNOSIS — Q72891 Other reduction defects of right lower limb: Secondary | ICD-10-CM | POA: Diagnosis not present

## 2021-06-09 DIAGNOSIS — M5432 Sciatica, left side: Secondary | ICD-10-CM | POA: Diagnosis not present

## 2021-06-09 DIAGNOSIS — M9903 Segmental and somatic dysfunction of lumbar region: Secondary | ICD-10-CM | POA: Diagnosis not present

## 2021-06-09 DIAGNOSIS — M9905 Segmental and somatic dysfunction of pelvic region: Secondary | ICD-10-CM | POA: Diagnosis not present

## 2021-06-09 DIAGNOSIS — M4316 Spondylolisthesis, lumbar region: Secondary | ICD-10-CM | POA: Diagnosis not present

## 2021-06-12 DIAGNOSIS — M9904 Segmental and somatic dysfunction of sacral region: Secondary | ICD-10-CM | POA: Diagnosis not present

## 2021-06-12 DIAGNOSIS — M9903 Segmental and somatic dysfunction of lumbar region: Secondary | ICD-10-CM | POA: Diagnosis not present

## 2021-06-12 DIAGNOSIS — M5135 Other intervertebral disc degeneration, thoracolumbar region: Secondary | ICD-10-CM | POA: Diagnosis not present

## 2021-06-12 DIAGNOSIS — M4316 Spondylolisthesis, lumbar region: Secondary | ICD-10-CM | POA: Diagnosis not present

## 2021-06-12 DIAGNOSIS — M5137 Other intervertebral disc degeneration, lumbosacral region: Secondary | ICD-10-CM | POA: Diagnosis not present

## 2021-06-12 DIAGNOSIS — M9905 Segmental and somatic dysfunction of pelvic region: Secondary | ICD-10-CM | POA: Diagnosis not present

## 2021-06-12 DIAGNOSIS — Q72891 Other reduction defects of right lower limb: Secondary | ICD-10-CM | POA: Diagnosis not present

## 2021-06-12 DIAGNOSIS — M5432 Sciatica, left side: Secondary | ICD-10-CM | POA: Diagnosis not present

## 2021-06-12 DIAGNOSIS — M5136 Other intervertebral disc degeneration, lumbar region: Secondary | ICD-10-CM | POA: Diagnosis not present

## 2021-06-13 DIAGNOSIS — R319 Hematuria, unspecified: Secondary | ICD-10-CM | POA: Diagnosis not present

## 2021-06-13 DIAGNOSIS — R310 Gross hematuria: Secondary | ICD-10-CM | POA: Diagnosis not present

## 2021-06-16 DIAGNOSIS — M4316 Spondylolisthesis, lumbar region: Secondary | ICD-10-CM | POA: Diagnosis not present

## 2021-06-16 DIAGNOSIS — M9903 Segmental and somatic dysfunction of lumbar region: Secondary | ICD-10-CM | POA: Diagnosis not present

## 2021-06-16 DIAGNOSIS — M9904 Segmental and somatic dysfunction of sacral region: Secondary | ICD-10-CM | POA: Diagnosis not present

## 2021-06-16 DIAGNOSIS — M5137 Other intervertebral disc degeneration, lumbosacral region: Secondary | ICD-10-CM | POA: Diagnosis not present

## 2021-06-16 DIAGNOSIS — M5432 Sciatica, left side: Secondary | ICD-10-CM | POA: Diagnosis not present

## 2021-06-16 DIAGNOSIS — M9905 Segmental and somatic dysfunction of pelvic region: Secondary | ICD-10-CM | POA: Diagnosis not present

## 2021-06-16 DIAGNOSIS — M5135 Other intervertebral disc degeneration, thoracolumbar region: Secondary | ICD-10-CM | POA: Diagnosis not present

## 2021-06-16 DIAGNOSIS — M5136 Other intervertebral disc degeneration, lumbar region: Secondary | ICD-10-CM | POA: Diagnosis not present

## 2021-06-16 DIAGNOSIS — Q72891 Other reduction defects of right lower limb: Secondary | ICD-10-CM | POA: Diagnosis not present

## 2021-06-23 DIAGNOSIS — M9904 Segmental and somatic dysfunction of sacral region: Secondary | ICD-10-CM | POA: Diagnosis not present

## 2021-06-23 DIAGNOSIS — M9903 Segmental and somatic dysfunction of lumbar region: Secondary | ICD-10-CM | POA: Diagnosis not present

## 2021-06-23 DIAGNOSIS — M9905 Segmental and somatic dysfunction of pelvic region: Secondary | ICD-10-CM | POA: Diagnosis not present

## 2021-06-23 DIAGNOSIS — M4316 Spondylolisthesis, lumbar region: Secondary | ICD-10-CM | POA: Diagnosis not present

## 2021-06-23 DIAGNOSIS — M5136 Other intervertebral disc degeneration, lumbar region: Secondary | ICD-10-CM | POA: Diagnosis not present

## 2021-06-23 DIAGNOSIS — M5135 Other intervertebral disc degeneration, thoracolumbar region: Secondary | ICD-10-CM | POA: Diagnosis not present

## 2021-06-23 DIAGNOSIS — M5137 Other intervertebral disc degeneration, lumbosacral region: Secondary | ICD-10-CM | POA: Diagnosis not present

## 2021-06-23 DIAGNOSIS — M5432 Sciatica, left side: Secondary | ICD-10-CM | POA: Diagnosis not present

## 2021-06-23 DIAGNOSIS — Q72891 Other reduction defects of right lower limb: Secondary | ICD-10-CM | POA: Diagnosis not present

## 2021-06-26 DIAGNOSIS — M5137 Other intervertebral disc degeneration, lumbosacral region: Secondary | ICD-10-CM | POA: Diagnosis not present

## 2021-06-26 DIAGNOSIS — M5136 Other intervertebral disc degeneration, lumbar region: Secondary | ICD-10-CM | POA: Diagnosis not present

## 2021-06-26 DIAGNOSIS — M4316 Spondylolisthesis, lumbar region: Secondary | ICD-10-CM | POA: Diagnosis not present

## 2021-06-26 DIAGNOSIS — M5135 Other intervertebral disc degeneration, thoracolumbar region: Secondary | ICD-10-CM | POA: Diagnosis not present

## 2021-06-26 DIAGNOSIS — M9903 Segmental and somatic dysfunction of lumbar region: Secondary | ICD-10-CM | POA: Diagnosis not present

## 2021-06-26 DIAGNOSIS — M9904 Segmental and somatic dysfunction of sacral region: Secondary | ICD-10-CM | POA: Diagnosis not present

## 2021-06-26 DIAGNOSIS — M5432 Sciatica, left side: Secondary | ICD-10-CM | POA: Diagnosis not present

## 2021-06-26 DIAGNOSIS — M9905 Segmental and somatic dysfunction of pelvic region: Secondary | ICD-10-CM | POA: Diagnosis not present

## 2021-06-26 DIAGNOSIS — Q72891 Other reduction defects of right lower limb: Secondary | ICD-10-CM | POA: Diagnosis not present

## 2021-07-02 DIAGNOSIS — R310 Gross hematuria: Secondary | ICD-10-CM | POA: Diagnosis not present

## 2021-07-02 DIAGNOSIS — R319 Hematuria, unspecified: Secondary | ICD-10-CM | POA: Diagnosis not present

## 2021-08-07 DIAGNOSIS — R509 Fever, unspecified: Secondary | ICD-10-CM | POA: Diagnosis not present
